# Patient Record
Sex: Female | Born: 1960 | Race: White | Hispanic: No | Smoking: Former smoker
Health system: Southern US, Community
[De-identification: ages and names within clinical notes are randomized; demographics above are authoritative.]

## PROBLEM LIST (undated history)

## (undated) DIAGNOSIS — Z8616 Personal history of COVID-19: Secondary | ICD-10-CM

## (undated) DIAGNOSIS — K219 Gastro-esophageal reflux disease without esophagitis: Secondary | ICD-10-CM

## (undated) DIAGNOSIS — M1990 Unspecified osteoarthritis, unspecified site: Secondary | ICD-10-CM

## (undated) HISTORY — DX: Personal history of COVID-19: Z86.16

---

## 2003-01-04 ENCOUNTER — Other Ambulatory Visit: Admission: RE | Admit: 2003-01-04 | Discharge: 2003-01-04 | Payer: Self-pay | Admitting: Obstetrics and Gynecology

## 2004-11-22 ENCOUNTER — Other Ambulatory Visit: Admission: RE | Admit: 2004-11-22 | Discharge: 2004-11-22 | Payer: Self-pay | Admitting: Obstetrics and Gynecology

## 2011-05-12 ENCOUNTER — Other Ambulatory Visit: Payer: Self-pay | Admitting: Family Medicine

## 2011-05-12 DIAGNOSIS — G834 Cauda equina syndrome: Secondary | ICD-10-CM

## 2011-05-13 ENCOUNTER — Other Ambulatory Visit: Payer: Self-pay

## 2011-05-14 ENCOUNTER — Other Ambulatory Visit: Payer: Self-pay

## 2011-05-17 ENCOUNTER — Ambulatory Visit
Admission: RE | Admit: 2011-05-17 | Discharge: 2011-05-17 | Disposition: A | Discharge status: Home / Self Care | Payer: BC Managed Care – PPO | Source: Ambulatory Visit | Attending: Family Medicine | Admitting: Family Medicine

## 2011-05-17 DIAGNOSIS — G834 Cauda equina syndrome: Secondary | ICD-10-CM

## 2015-11-20 DIAGNOSIS — H524 Presbyopia: Secondary | ICD-10-CM | POA: Diagnosis not present

## 2016-09-04 DIAGNOSIS — L821 Other seborrheic keratosis: Secondary | ICD-10-CM | POA: Diagnosis not present

## 2016-09-04 DIAGNOSIS — Z720 Tobacco use: Secondary | ICD-10-CM | POA: Diagnosis not present

## 2016-09-09 DIAGNOSIS — L821 Other seborrheic keratosis: Secondary | ICD-10-CM | POA: Diagnosis not present

## 2017-05-17 DIAGNOSIS — J069 Acute upper respiratory infection, unspecified: Secondary | ICD-10-CM | POA: Diagnosis not present

## 2017-06-18 DIAGNOSIS — H524 Presbyopia: Secondary | ICD-10-CM | POA: Diagnosis not present

## 2018-01-22 ENCOUNTER — Other Ambulatory Visit: Payer: Self-pay | Admitting: Family Medicine

## 2018-01-22 DIAGNOSIS — Z1231 Encounter for screening mammogram for malignant neoplasm of breast: Secondary | ICD-10-CM

## 2018-01-22 DIAGNOSIS — R5383 Other fatigue: Secondary | ICD-10-CM | POA: Diagnosis not present

## 2018-01-22 DIAGNOSIS — N951 Menopausal and female climacteric states: Secondary | ICD-10-CM | POA: Diagnosis not present

## 2018-01-22 DIAGNOSIS — Z136 Encounter for screening for cardiovascular disorders: Secondary | ICD-10-CM | POA: Diagnosis not present

## 2018-01-22 DIAGNOSIS — Z23 Encounter for immunization: Secondary | ICD-10-CM | POA: Diagnosis not present

## 2018-01-22 DIAGNOSIS — Z0000 Encounter for general adult medical examination without abnormal findings: Secondary | ICD-10-CM | POA: Diagnosis not present

## 2018-01-22 DIAGNOSIS — E2839 Other primary ovarian failure: Secondary | ICD-10-CM

## 2018-03-11 DIAGNOSIS — J069 Acute upper respiratory infection, unspecified: Secondary | ICD-10-CM | POA: Diagnosis not present

## 2018-03-22 ENCOUNTER — Ambulatory Visit
Admission: RE | Admit: 2018-03-22 | Discharge: 2018-03-22 | Disposition: A | Discharge status: Home / Self Care | Payer: BLUE CROSS/BLUE SHIELD | Source: Ambulatory Visit | Attending: Family Medicine | Admitting: Family Medicine

## 2018-03-22 ENCOUNTER — Encounter: Payer: Self-pay | Admitting: Radiology

## 2018-03-22 DIAGNOSIS — Z1231 Encounter for screening mammogram for malignant neoplasm of breast: Secondary | ICD-10-CM | POA: Diagnosis not present

## 2018-03-22 DIAGNOSIS — M8588 Other specified disorders of bone density and structure, other site: Secondary | ICD-10-CM | POA: Diagnosis not present

## 2018-03-22 DIAGNOSIS — Z780 Asymptomatic menopausal state: Secondary | ICD-10-CM | POA: Diagnosis not present

## 2018-03-22 DIAGNOSIS — M85852 Other specified disorders of bone density and structure, left thigh: Secondary | ICD-10-CM | POA: Diagnosis not present

## 2018-03-22 DIAGNOSIS — E2839 Other primary ovarian failure: Secondary | ICD-10-CM

## 2018-04-19 DIAGNOSIS — M810 Age-related osteoporosis without current pathological fracture: Secondary | ICD-10-CM | POA: Diagnosis not present

## 2018-04-19 DIAGNOSIS — F17210 Nicotine dependence, cigarettes, uncomplicated: Secondary | ICD-10-CM | POA: Diagnosis not present

## 2018-04-23 DIAGNOSIS — L820 Inflamed seborrheic keratosis: Secondary | ICD-10-CM | POA: Diagnosis not present

## 2018-06-09 DIAGNOSIS — B349 Viral infection, unspecified: Secondary | ICD-10-CM | POA: Diagnosis not present

## 2018-11-26 DIAGNOSIS — H524 Presbyopia: Secondary | ICD-10-CM | POA: Diagnosis not present

## 2018-12-20 DIAGNOSIS — M546 Pain in thoracic spine: Secondary | ICD-10-CM | POA: Diagnosis not present

## 2018-12-20 DIAGNOSIS — M9902 Segmental and somatic dysfunction of thoracic region: Secondary | ICD-10-CM | POA: Diagnosis not present

## 2018-12-20 DIAGNOSIS — M9903 Segmental and somatic dysfunction of lumbar region: Secondary | ICD-10-CM | POA: Diagnosis not present

## 2018-12-20 DIAGNOSIS — M62830 Muscle spasm of back: Secondary | ICD-10-CM | POA: Diagnosis not present

## 2018-12-21 DIAGNOSIS — M546 Pain in thoracic spine: Secondary | ICD-10-CM | POA: Diagnosis not present

## 2018-12-21 DIAGNOSIS — M9902 Segmental and somatic dysfunction of thoracic region: Secondary | ICD-10-CM | POA: Diagnosis not present

## 2018-12-21 DIAGNOSIS — M62830 Muscle spasm of back: Secondary | ICD-10-CM | POA: Diagnosis not present

## 2018-12-21 DIAGNOSIS — M9903 Segmental and somatic dysfunction of lumbar region: Secondary | ICD-10-CM | POA: Diagnosis not present

## 2018-12-23 DIAGNOSIS — M9902 Segmental and somatic dysfunction of thoracic region: Secondary | ICD-10-CM | POA: Diagnosis not present

## 2018-12-23 DIAGNOSIS — M546 Pain in thoracic spine: Secondary | ICD-10-CM | POA: Diagnosis not present

## 2018-12-23 DIAGNOSIS — M62830 Muscle spasm of back: Secondary | ICD-10-CM | POA: Diagnosis not present

## 2018-12-23 DIAGNOSIS — M9903 Segmental and somatic dysfunction of lumbar region: Secondary | ICD-10-CM | POA: Diagnosis not present

## 2018-12-30 DIAGNOSIS — M4854XA Collapsed vertebra, not elsewhere classified, thoracic region, initial encounter for fracture: Secondary | ICD-10-CM | POA: Diagnosis not present

## 2018-12-30 DIAGNOSIS — M546 Pain in thoracic spine: Secondary | ICD-10-CM | POA: Diagnosis not present

## 2019-05-28 DIAGNOSIS — Z20828 Contact with and (suspected) exposure to other viral communicable diseases: Secondary | ICD-10-CM | POA: Diagnosis not present

## 2019-05-31 DIAGNOSIS — Z1152 Encounter for screening for COVID-19: Secondary | ICD-10-CM | POA: Diagnosis not present

## 2019-05-31 DIAGNOSIS — B349 Viral infection, unspecified: Secondary | ICD-10-CM | POA: Diagnosis not present

## 2019-07-08 ENCOUNTER — Ambulatory Visit: Payer: BC Managed Care – PPO | Attending: Internal Medicine

## 2019-07-08 DIAGNOSIS — Z23 Encounter for immunization: Secondary | ICD-10-CM

## 2019-07-08 NOTE — Progress Notes (Signed)
   Covid-19 Vaccination Clinic  Name:  Whitney Jennings    MRN: [id number] DOB: February 05, 1961  07/08/2019  [name] was observed post Covid-19 immunization for 15 minutes without incident. She was provided with Vaccine Information Sheet and instruction to access the V-Safe system.   [name] was instructed to call 911 with any severe reactions post vaccine: Marland Kitchen Difficulty breathing  . Swelling of face and throat  . A fast heartbeat  . A bad rash all over body  . Dizziness and weakness   Immunizations Administered    Name Date Dose VIS Date Route   Pfizer COVID-19 Vaccine 07/08/2019  1:42 PM 0.3 mL 05/11/2018 Intramuscular   Manufacturer: [employer], [employer]   Lot: [id number]   NDC: [address]-1

## 2019-08-06 ENCOUNTER — Ambulatory Visit: Payer: BC Managed Care – PPO | Attending: Internal Medicine

## 2019-08-06 DIAGNOSIS — Z23 Encounter for immunization: Secondary | ICD-10-CM

## 2019-08-06 NOTE — Progress Notes (Signed)
   Covid-19 Vaccination Clinic  Name:  ZHOE CATANIA    MRN: [id number] DOB: 20-Jul-1960  08/06/2019  Ms. Hedger was observed post Covid-19 immunization for 15 minutes without incident. She was provided with Vaccine Information Sheet and instruction to access the V-Safe system.   Ms. Parkhill was instructed to call 911 with any severe reactions post vaccine: Marland Kitchen Difficulty breathing  . Swelling of face and throat  . A fast heartbeat  . A bad rash all over body  . Dizziness and weakness   Immunizations Administered    Name Date Dose VIS Date Route   Pfizer COVID-19 Vaccine 08/06/2019 12:11 PM 0.3 mL 05/11/2018 Intramuscular   Manufacturer: [employer], [employer]   Lot: [id number]   NDC: [address]-0

## 2019-08-08 ENCOUNTER — Ambulatory Visit: Payer: BLUE CROSS/BLUE SHIELD

## 2019-09-16 DIAGNOSIS — M795 Residual foreign body in soft tissue: Secondary | ICD-10-CM | POA: Diagnosis not present

## 2019-12-16 DIAGNOSIS — H52223 Regular astigmatism, bilateral: Secondary | ICD-10-CM | POA: Diagnosis not present

## 2020-01-20 DIAGNOSIS — Z20822 Contact with and (suspected) exposure to covid-19: Secondary | ICD-10-CM | POA: Diagnosis not present

## 2020-02-21 DIAGNOSIS — R5383 Other fatigue: Secondary | ICD-10-CM | POA: Diagnosis not present

## 2020-03-31 IMAGING — MG DIGITAL SCREENING BILATERAL MAMMOGRAM WITH TOMO AND CAD
8 series · 9 of 24 positions shown · non-contrast
Comparison: Previous exam(s).

CLINICAL DATA: Screening.

EXAM:
DIGITAL SCREENING BILATERAL MAMMOGRAM WITH TOMO AND CAD

[L CC synth-2D]
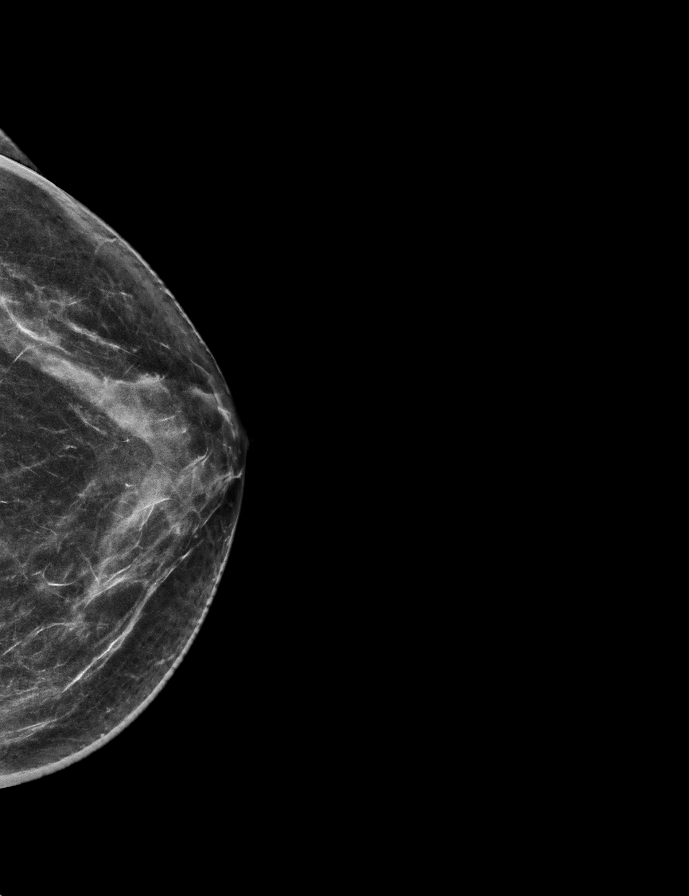

[R CC synth-2D]
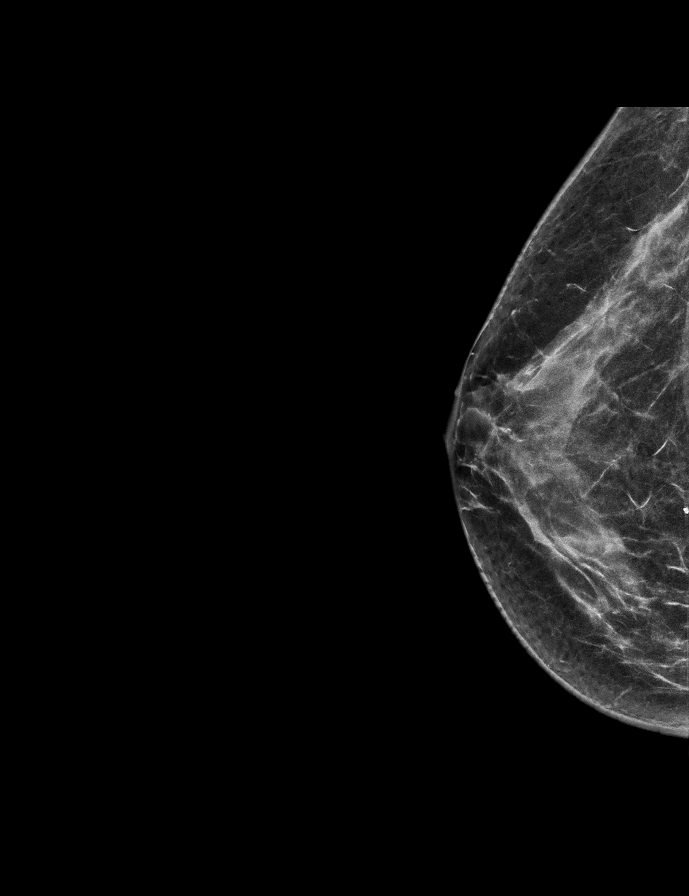

[R MLO synth-2D]
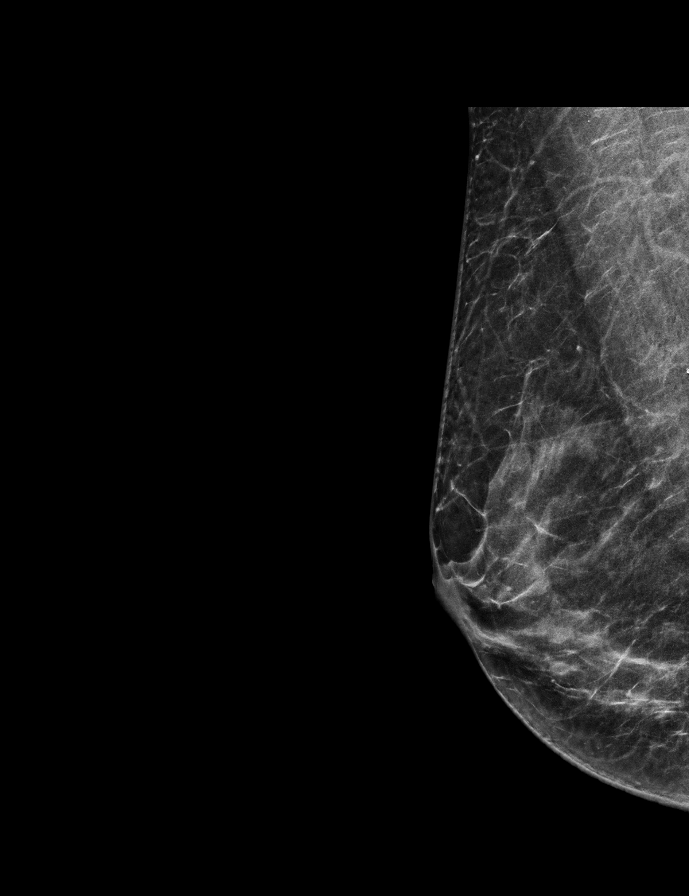

[L MLO synth-2D]
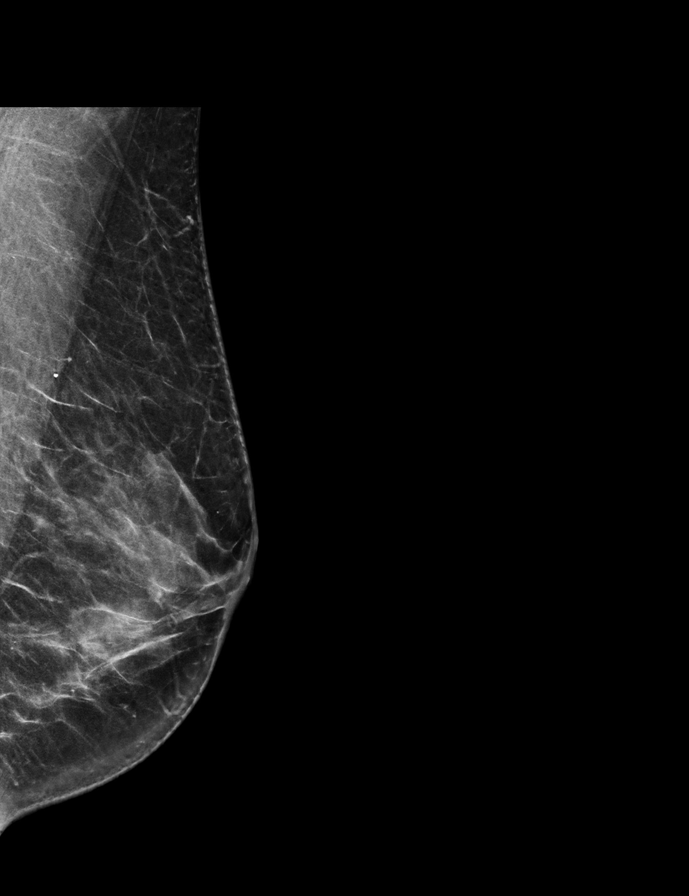

[R CC tomo · 2 of 60 frames shown]
[frame 20/60]
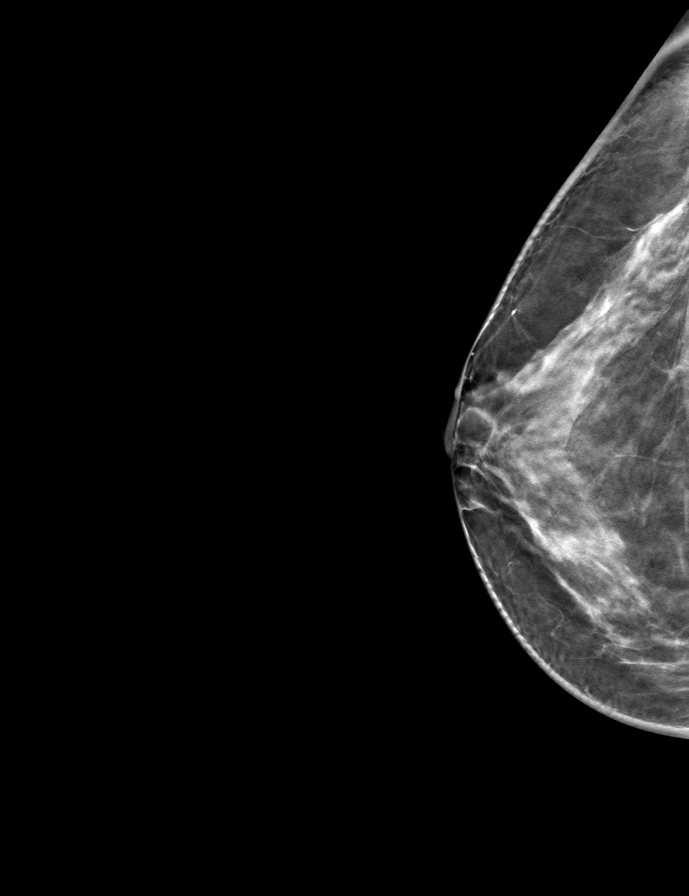
[frame 31/60]
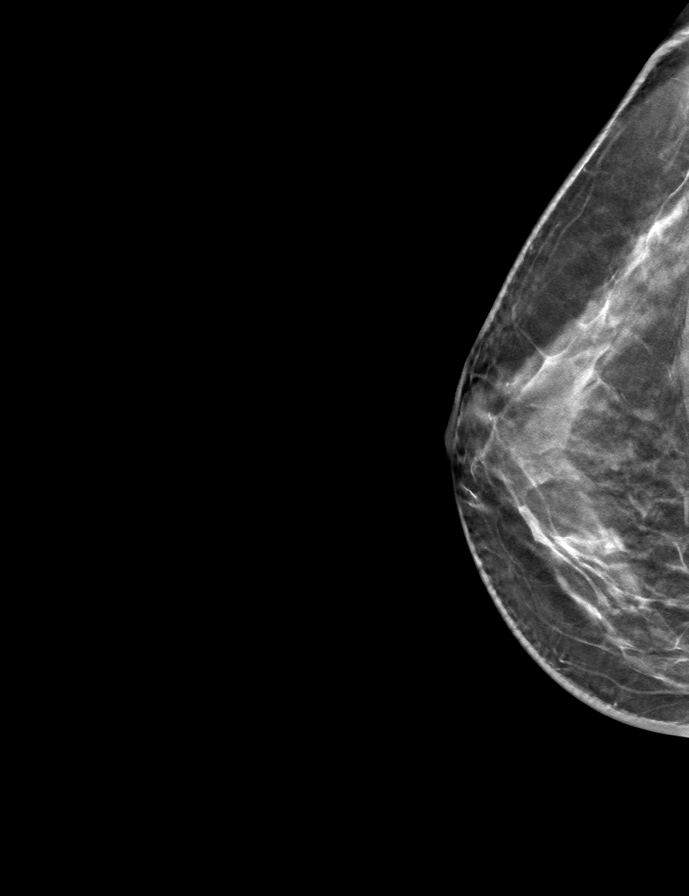

[L MLO tomo · tomo slice 33/64.0]
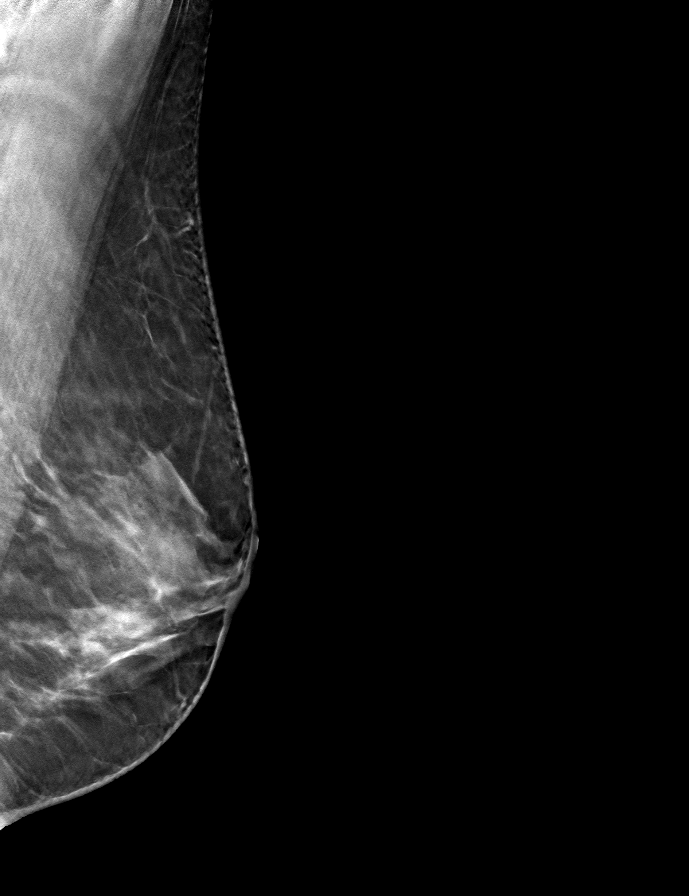

[R MLO tomo · tomo slice 29/56.0]
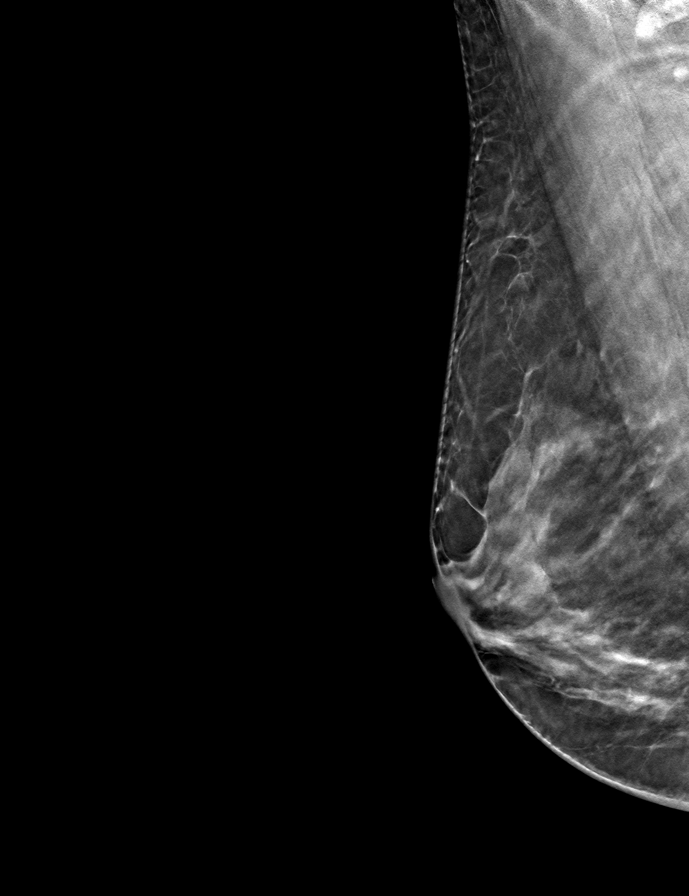

[L CC tomo · tomo slice 31/61.0]
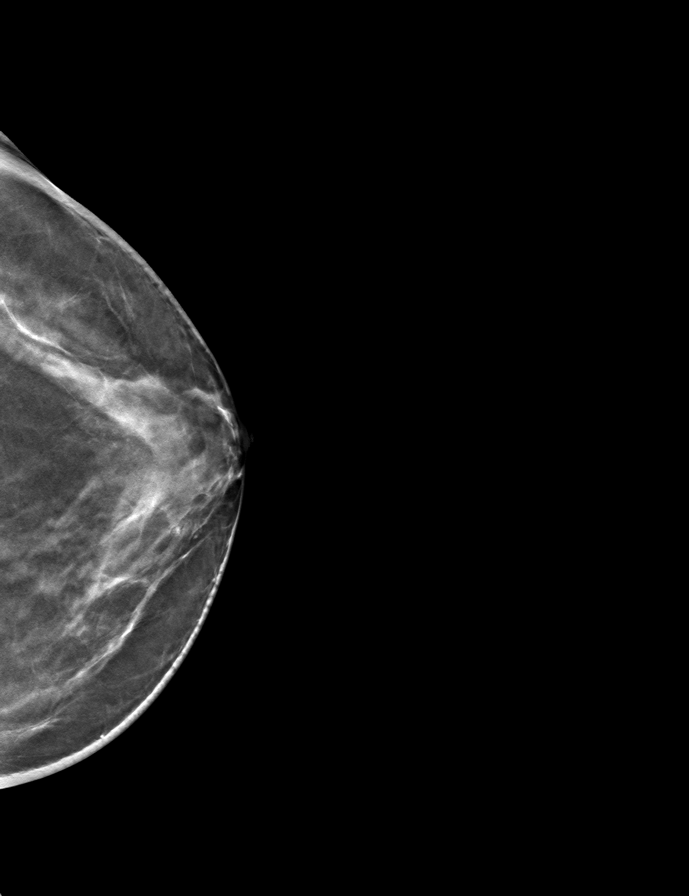

[9 of 24 positions shown; findings below may reference images not displayed]

ACR Breast Density Category c: The breast tissue is heterogeneously
dense, which may obscure small masses.
FINDINGS: There are no findings suspicious for malignancy. Images were
processed with CAD.
IMPRESSION: No mammographic evidence of malignancy. A result letter of this
screening mammogram will be mailed directly to the patient.

RECOMMENDATION:
Screening mammogram in one year. (Code:[id number])

BI-RADS CATEGORY  1: Negative.

## 2020-06-10 DIAGNOSIS — R5383 Other fatigue: Secondary | ICD-10-CM | POA: Diagnosis not present

## 2020-06-11 DIAGNOSIS — R5383 Other fatigue: Secondary | ICD-10-CM | POA: Diagnosis not present

## 2020-06-18 ENCOUNTER — Other Ambulatory Visit: Payer: Self-pay | Admitting: Family Medicine

## 2020-06-18 ENCOUNTER — Other Ambulatory Visit: Payer: Self-pay

## 2020-06-18 ENCOUNTER — Ambulatory Visit
Admission: RE | Admit: 2020-06-18 | Discharge: 2020-06-18 | Disposition: A | Discharge status: Home / Self Care | Payer: BC Managed Care – PPO | Source: Ambulatory Visit | Attending: Family Medicine | Admitting: Family Medicine

## 2020-06-18 DIAGNOSIS — I951 Orthostatic hypotension: Secondary | ICD-10-CM | POA: Diagnosis not present

## 2020-06-18 DIAGNOSIS — R5383 Other fatigue: Secondary | ICD-10-CM | POA: Diagnosis not present

## 2020-07-10 NOTE — Progress Notes (Signed)
Date:  07/12/2020   ID:  Whitney Jennings, DOB 1960/09/03, MRN [id number]  PCP:  Shirline Frees, MD  Cardiologist:  Rex Kras, DO, [facility] (established care 07/12/2020)  REASON FOR CONSULT: Orthostatic hypotension  REQUESTING PHYSICIAN:  Shirline Frees, MD [address] [place],  [place] [address]  Chief Complaint  Patient presents with  . Orthostatic hypotension  . New Patient (Initial Visit)  . Fatigue    HPI  Whitney Jennings is a 60 y.o. female who presents to the office with a chief complaint of "fatigue / orthostatic hypotension." Patient's past medical history and cardiovascular risk factors include: Cigarette smoking, Hx of COVID 19 infection.   She is referred to the office at the request of Shirline Frees, MD for evaluation of orthostatic hypotension.  Patient states that she has been experiencing generalized tired and fatigue for some time now and recently went to her PCP for further evaluation.  During office visit patient states that she had orthostatic hypotension with a drop of blood pressure of approximately 30 mmHg.  She was referred to cardiology for further evaluation and management.  Patient denies any chest pain or anginal equivalent.  She is overall euvolemic and not in congestive heart failure.  Patient states that the fatigue has been going on for at least 2 years.  Some days are worse than others.  Patient's fianc who is present at today's visit notes that there are days when " she cannot function, is on the couch all day."  Patient states that she does not skip any meals and has to consume protein rich snacks/meals every 2 hours to minimize her symptoms of generalized tired/fatigue.  She has also noticed lightheaded and dizziness with changing positions.  The symptoms are not reproducible with turning her head side to side.  No near-syncope or syncopal events.  Patient states that she consumes 2 cups of coffee in the morning and 2-3 glasses of  wine per week.  She denies the use of sodas, illicit drugs, energy drinks, stimulant medications, weight loss supplements.  She does consume a protein shake which is noted to have 70 different extracts (no additional details known).  She smokes on a regular basis less than 1 pack/day and has been doing so for at least the past 40 years.  Brother had MI at age of 43 does not know additional details.   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  Recently start walking 45 minutes a day.  ALLERGIES: No Known Allergies  MEDICATION LIST PRIOR TO VISIT: No outpatient medications have been marked as taking for the 07/12/20 encounter (Office Visit) with Rex Kras, DO.     PAST MEDICAL HISTORY: Past Medical History:  Diagnosis Date  . History of COVID-19     PAST SURGICAL HISTORY: History reviewed. No pertinent surgical history.  FAMILY HISTORY: The patient family history includes Diabetes in her brother and father; Pulmonary fibrosis in her father.  SOCIAL HISTORY:  The patient  reports that she has been smoking cigarettes. She has a 40.00 pack-year smoking history. She has never used smokeless tobacco. She reports current alcohol use of about 3.0 standard drinks of alcohol per week. She reports that she does not use drugs.  REVIEW OF SYSTEMS: Review of Systems  Constitutional: Positive for malaise/fatigue. Negative for chills and fever.  HENT: Negative for hoarse voice and nosebleeds.   Eyes: Negative for discharge, double vision and  pain.  Cardiovascular: Negative for chest pain, claudication, dyspnea on exertion, leg swelling, near-syncope, orthopnea, palpitations, paroxysmal nocturnal dyspnea and syncope.  Respiratory: Negative for hemoptysis and shortness of breath.   Musculoskeletal: Negative for muscle cramps and myalgias.  Gastrointestinal: Negative for abdominal pain, constipation, diarrhea, hematemesis, hematochezia, melena,  nausea and vomiting.  Neurological: Positive for dizziness and light-headedness.    PHYSICAL EXAM: Vitals with BMI 07/12/2020  Height $Remov'5\' 4"'EoyMIB$   Weight 134 lbs 6 oz  BMI 40.34  Systolic 742  Diastolic 67  Pulse 93   Orthostatic VS for the past 72 hrs (Last 3 readings):  Orthostatic BP Patient Position BP Location Cuff Size Orthostatic Pulse  07/12/20 1255 112/60 Standing Left Arm Normal 94  07/12/20 1254 121/66 Sitting Left Arm Normal 84  07/12/20 1253 117/63 Supine Left Arm Normal 85   CONSTITUTIONAL: Well-developed and well-nourished. No acute distress.  SKIN: Skin is warm and dry. No rash noted. No cyanosis. No pallor. No jaundice HEAD: Normocephalic and atraumatic.  EYES: No scleral icterus MOUTH/THROAT: Moist oral membranes.  NECK: No JVD present. No thyromegaly noted. No carotid bruits  LYMPHATIC: No visible cervical adenopathy.  CHEST Normal respiratory effort. No intercostal retractions  LUNGS: Clear to auscultation bilaterally.  No stridor. No wheezes. No rales.  CARDIOVASCULAR: Regular rate and rhythm, positive S1-S2, no murmurs rubs or gallops appreciated. ABDOMINAL: No apparent ascites.  EXTREMITIES: No peripheral edema  HEMATOLOGIC: No significant bruising NEUROLOGIC: Oriented to person, place, and time. Nonfocal. Normal muscle tone.  PSYCHIATRIC: Normal mood and affect. Normal behavior. Cooperative  CARDIAC DATABASE: EKG: 07/12/2020: Normal sinus rhythm, 81 bpm, normal axis, without underlying injury pattern.  Echocardiogram: No results found for this or any previous visit from the past 1095 days.   Stress Testing: No results found for this or any previous visit from the past 1095 days.  Heart Catheterization: None  LABORATORY DATA: No flowsheet data found.  No flowsheet data found.  Lipid Panel  No results found for: CHOL, TRIG, HDL, CHOLHDL, VLDL, LDLCALC, LDLDIRECT, LABVLDL  No components found for: NTPROBNP No results for input(s): PROBNP in the  last 8760 hours. No results for input(s): TSH in the last 8760 hours.  BMP No results for input(s): NA, K, CL, CO2, GLUCOSE, BUN, CREATININE, CALCIUM, GFRNONAA, GFRAA in the last 8760 hours.  HEMOGLOBIN A1C No results found for: HGBA1C, MPG   External Labs: Collected: 06/18/2020 Sodium 143, potassium 5.3, chloride 104, bicarb 32, BUN 23, creatinine 0.83, AST 24, ALT 27, alkaline phosphatase 82 CPK 79 (within normal limits) ESR 3 Cortisol 11.3  Date: 02/27/2020 Hemoglobin 15.8 g/dL, hematocrit 46.6%  IMPRESSION:    ICD-10-CM   1. Orthostatic hypotension  I95.1 EKG 12-Lead  2. Smoking  F17.200   3. History of COVID-19  Z86.16 PCV ECHOCARDIOGRAM COMPLETE    PCV CARDIAC STRESS TEST    SARS-COV-2 RNA,(COVID-19) QUAL NAAT  4. Family history of heart disease  Z82.49      RECOMMENDATIONS: BRIELE LAGASSE is a 60 y.o. female whose past medical history and cardiac risk factors include: Cigarette smoking, Hx of COVID 19 infection.   Patient does not have any active chest pain or anginal symptoms.  She is overall euvolemic and not in congestive heart failure.  Her symptoms of fatigue have been present for the last 2 years and I suspect it is most likely metabolic will defer to primary for additional work-up.  May consider endocrinology/dietitian to help with caloric intake and consumption of appropriate micro micronutrients.  Patient  is noted to have history of orthostatic hypotension during her last PCPs visit; however, orthostatics at today's office visit were within normal limits.  She has symptoms of lightheaded and dizziness with change in position and I have asked her to change her positions slowly, keeping her self well-hydrated, and days when she has exacerbation of the symptoms to increase some salt intake.  She can also consider compression stockings and avoid such activities that precipitate her symptoms of lightheaded and dizziness if possible.  With regards to her family history  of heart disease and history of COVID-19 infection and generalized tired and fatigue has been prolonged for the last 2 years we discussed undergoing an echocardiogram to evaluate for structural heart disease and GXT to evaluate for functional status and exercise-induced ischemia.  Patient states that she would like to discuss this further with PCP and will have it scheduled.  Educated on importance of complete smoking cessation.  Patient states that she been smoking for the last 40 years and currently is less than 1 pack/day.  Patient appears to be willing to stop smoking and will discuss with PCP with regards to further assistance and guidance.  FINAL MEDICATION LIST END OF ENCOUNTER: No orders of the defined types were placed in this encounter.   There are no discontinued medications.  No current outpatient medications on file.  Orders Placed This Encounter  Procedures  . SARS-COV-2 RNA,(COVID-19) QUAL NAAT  . PCV CARDIAC STRESS TEST  . EKG 12-Lead  . PCV ECHOCARDIOGRAM COMPLETE    There are no Patient Instructions on file for this visit.   --Continue cardiac medications as reconciled in final medication list. --Return in about 4 weeks (around 08/09/2020) for Follow up. Or sooner if needed. --Continue follow-up with your primary care physician regarding the management of your other chronic comorbid conditions.  Patient's questions and concerns were addressed to her satisfaction. She voices understanding of the instructions provided during this encounter.   This note was created using a voice recognition software as a result there may be grammatical errors inadvertently enclosed that do not reflect the nature of this encounter. Every attempt is made to correct such errors.  Rex Kras, [place], [facility]  Pager: [phone] Office: [phone]

## 2020-07-12 ENCOUNTER — Encounter: Payer: Self-pay | Admitting: Cardiology

## 2020-07-12 ENCOUNTER — Ambulatory Visit: Payer: BC Managed Care – PPO | Admitting: Cardiology

## 2020-07-12 ENCOUNTER — Other Ambulatory Visit: Payer: Self-pay

## 2020-07-12 VITALS — BP 113/67 | HR 93 | Temp 98.4°F | Resp 16 | Ht 64.0 in | Wt 134.4 lb

## 2020-07-12 DIAGNOSIS — Z8249 Family history of ischemic heart disease and other diseases of the circulatory system: Secondary | ICD-10-CM

## 2020-07-12 DIAGNOSIS — F17210 Nicotine dependence, cigarettes, uncomplicated: Secondary | ICD-10-CM | POA: Diagnosis not present

## 2020-07-12 DIAGNOSIS — I951 Orthostatic hypotension: Secondary | ICD-10-CM | POA: Diagnosis not present

## 2020-07-12 DIAGNOSIS — Z8616 Personal history of COVID-19: Secondary | ICD-10-CM | POA: Diagnosis not present

## 2020-07-12 DIAGNOSIS — F17200 Nicotine dependence, unspecified, uncomplicated: Secondary | ICD-10-CM

## 2020-07-19 DIAGNOSIS — R5383 Other fatigue: Secondary | ICD-10-CM | POA: Diagnosis not present

## 2020-09-26 DIAGNOSIS — J029 Acute pharyngitis, unspecified: Secondary | ICD-10-CM | POA: Diagnosis not present

## 2020-09-26 DIAGNOSIS — U071 COVID-19: Secondary | ICD-10-CM | POA: Diagnosis not present

## 2021-02-13 DIAGNOSIS — H52221 Regular astigmatism, right eye: Secondary | ICD-10-CM | POA: Diagnosis not present

## 2021-06-11 DIAGNOSIS — U071 COVID-19: Secondary | ICD-10-CM | POA: Diagnosis not present

## 2022-02-18 DIAGNOSIS — H5203 Hypermetropia, bilateral: Secondary | ICD-10-CM | POA: Diagnosis not present

## 2022-03-13 DIAGNOSIS — F17200 Nicotine dependence, unspecified, uncomplicated: Secondary | ICD-10-CM | POA: Diagnosis not present

## 2022-03-13 DIAGNOSIS — M810 Age-related osteoporosis without current pathological fracture: Secondary | ICD-10-CM | POA: Diagnosis not present

## 2022-03-13 DIAGNOSIS — Z0000 Encounter for general adult medical examination without abnormal findings: Secondary | ICD-10-CM | POA: Diagnosis not present

## 2022-03-13 DIAGNOSIS — Z83438 Family history of other disorder of lipoprotein metabolism and other lipidemia: Secondary | ICD-10-CM | POA: Diagnosis not present

## 2022-03-13 DIAGNOSIS — Z13220 Encounter for screening for lipoid disorders: Secondary | ICD-10-CM | POA: Diagnosis not present

## 2022-03-18 ENCOUNTER — Other Ambulatory Visit: Payer: Self-pay | Admitting: Family Medicine

## 2022-03-18 DIAGNOSIS — Z1231 Encounter for screening mammogram for malignant neoplasm of breast: Secondary | ICD-10-CM

## 2022-03-18 DIAGNOSIS — M810 Age-related osteoporosis without current pathological fracture: Secondary | ICD-10-CM

## 2022-06-28 IMAGING — CR DG CHEST 2V
2 series · 2 of 2 positions shown · non-contrast
Comparison: None.

CLINICAL DATA: 59-year-old female with orthostatic hypotension

EXAM:
CHEST - 2 VIEW

[w chest pa]
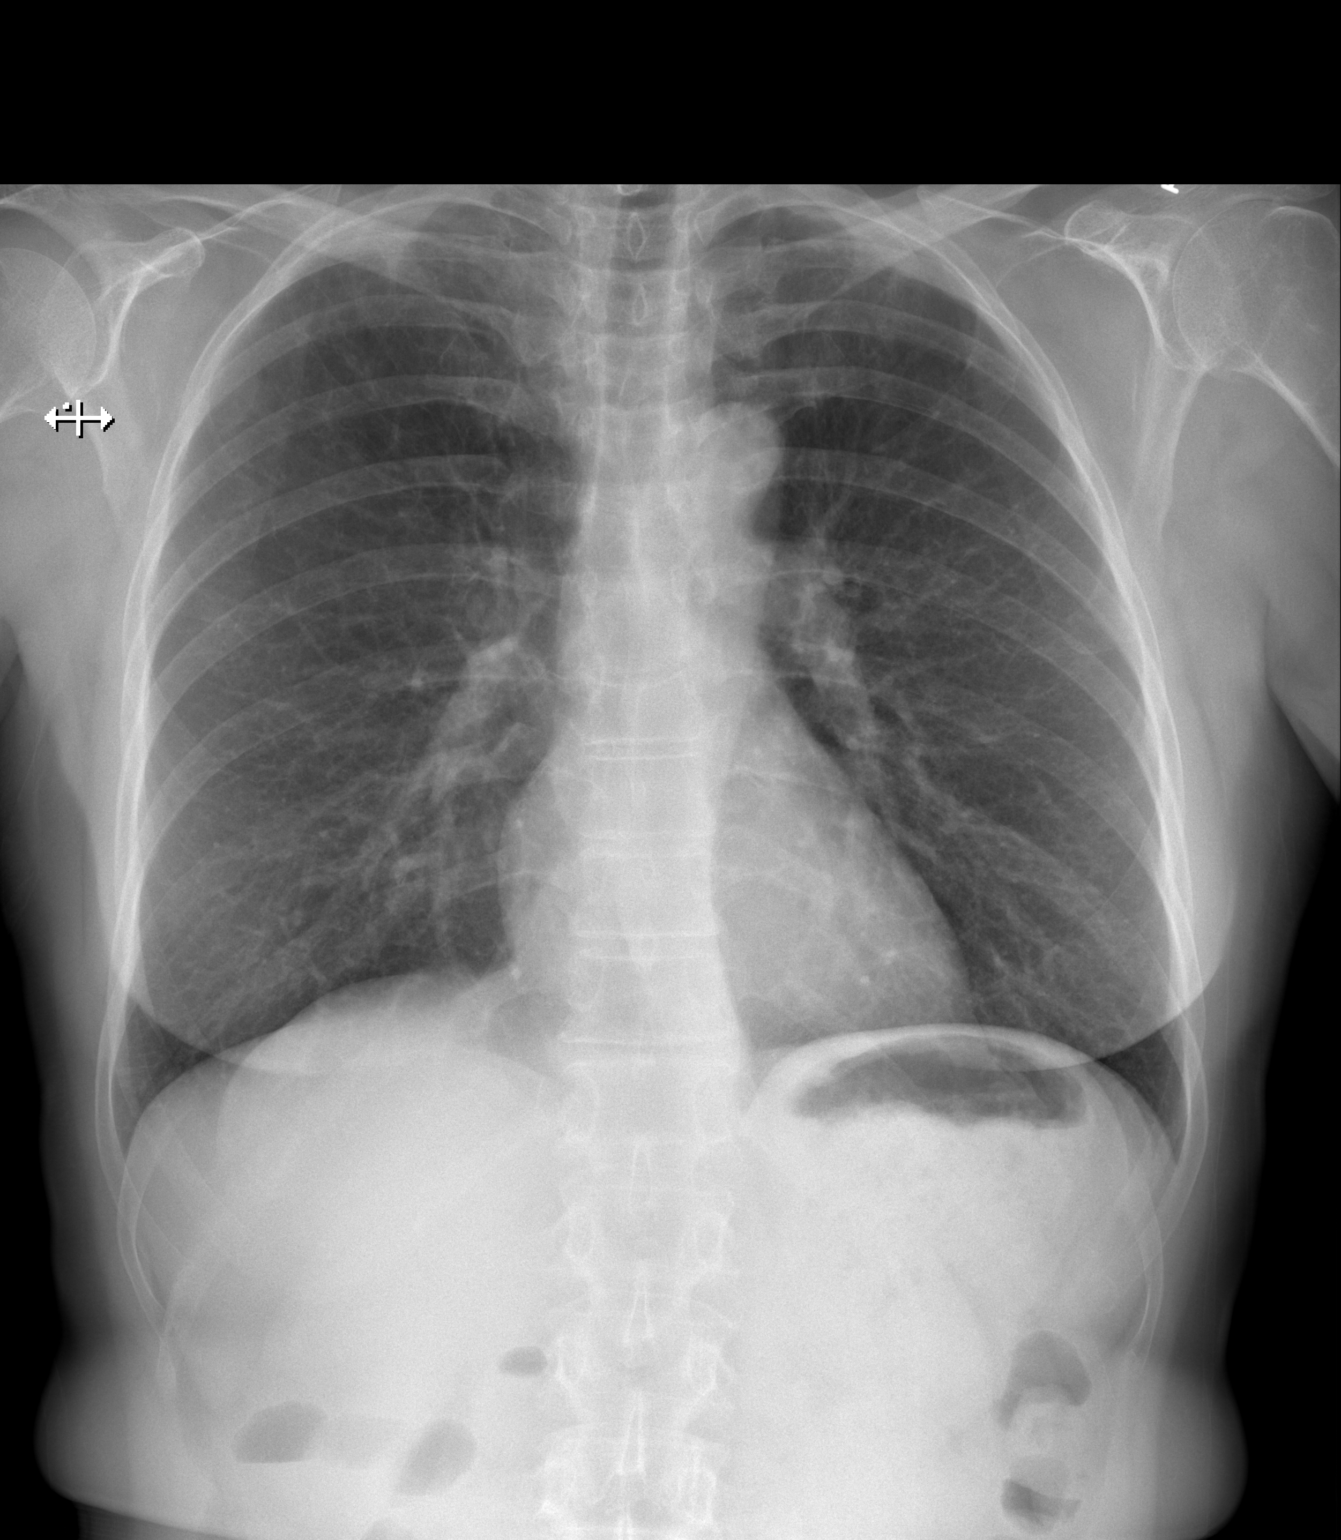

[w chest lat]
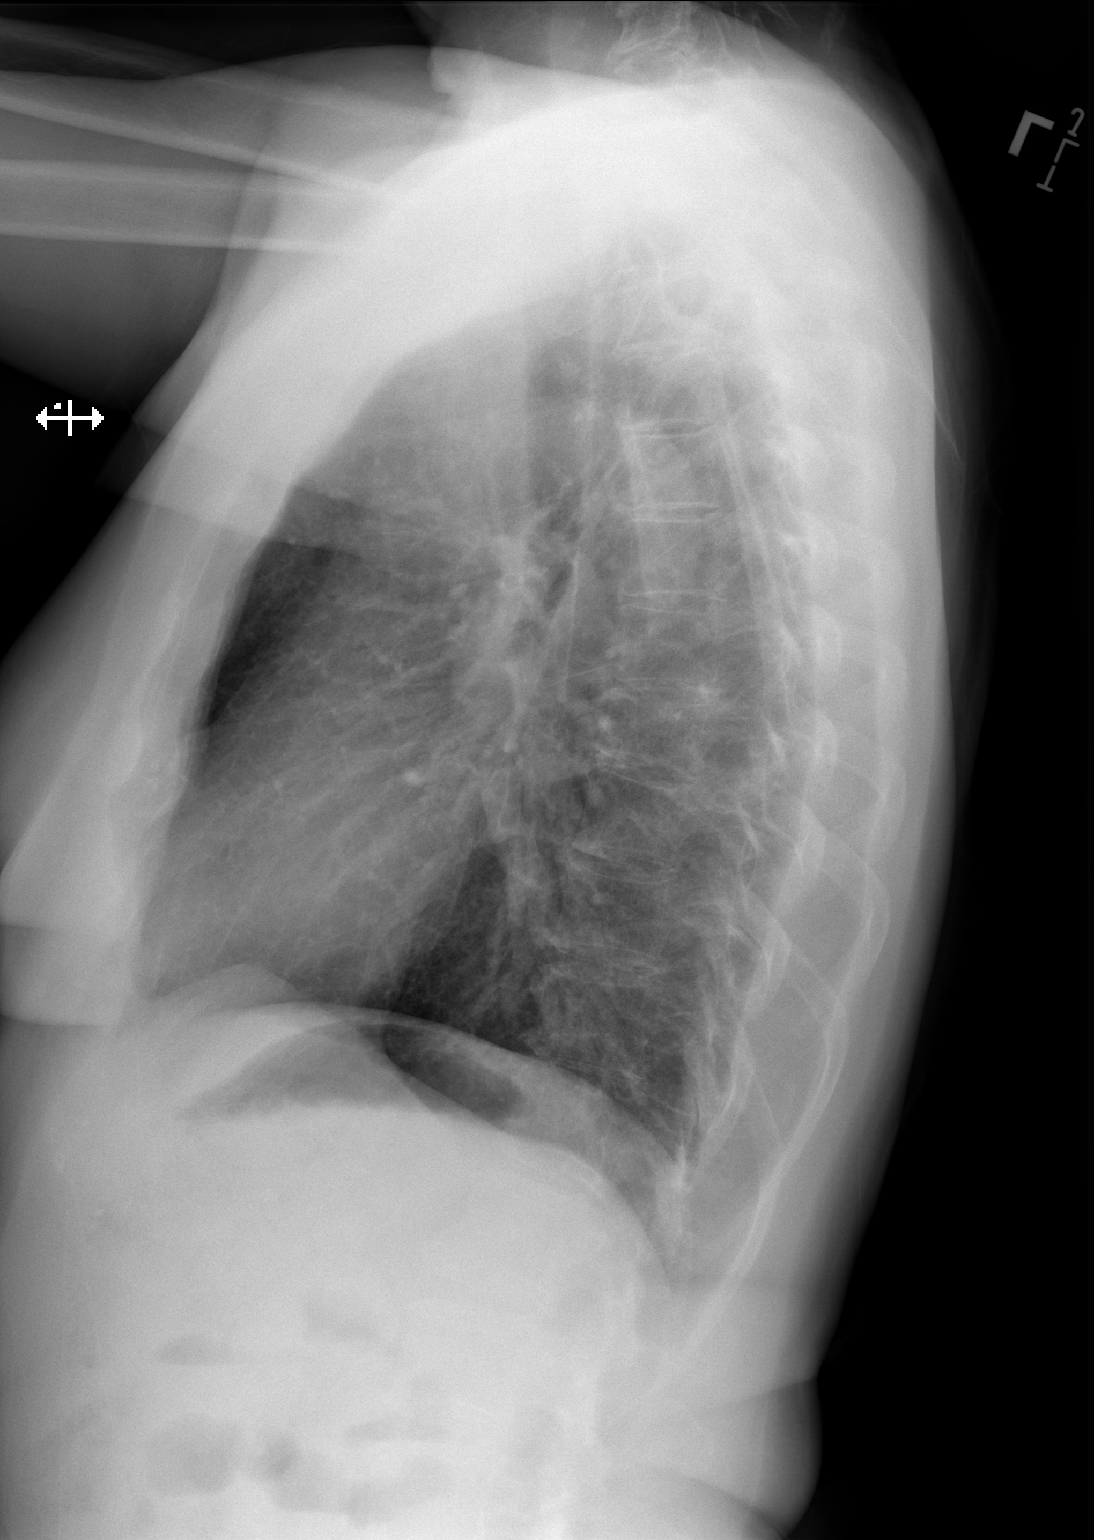

[2 of 2 positions shown; findings below may reference images not displayed]

FINDINGS: Cardiomediastinal silhouette within normal limits in size and
contour. No evidence of central vascular congestion. No interlobular
septal thickening. No pneumothorax or pleural effusion.
Pleuroparenchymal thickening at the apices, without comparison. No
confluent airspace disease.

Coarsened interstitial markings.

No acute displaced fracture.
IMPRESSION: Favored chronic lung changes without evidence of acute
cardiopulmonary disease

## 2022-08-22 DIAGNOSIS — D122 Benign neoplasm of ascending colon: Secondary | ICD-10-CM | POA: Diagnosis not present

## 2022-08-22 DIAGNOSIS — Z1211 Encounter for screening for malignant neoplasm of colon: Secondary | ICD-10-CM | POA: Diagnosis not present

## 2022-08-22 DIAGNOSIS — K5730 Diverticulosis of large intestine without perforation or abscess without bleeding: Secondary | ICD-10-CM | POA: Diagnosis not present

## 2022-08-22 DIAGNOSIS — K2930 Chronic superficial gastritis without bleeding: Secondary | ICD-10-CM | POA: Diagnosis not present

## 2022-08-22 DIAGNOSIS — K648 Other hemorrhoids: Secondary | ICD-10-CM | POA: Diagnosis not present

## 2022-08-22 DIAGNOSIS — K3189 Other diseases of stomach and duodenum: Secondary | ICD-10-CM | POA: Diagnosis not present

## 2022-08-22 DIAGNOSIS — R1084 Generalized abdominal pain: Secondary | ICD-10-CM | POA: Diagnosis not present

## 2022-08-22 DIAGNOSIS — R140 Abdominal distension (gaseous): Secondary | ICD-10-CM | POA: Diagnosis not present

## 2022-09-10 ENCOUNTER — Ambulatory Visit: Payer: BC Managed Care – PPO

## 2022-09-10 ENCOUNTER — Inpatient Hospital Stay: Admission: RE | Admit: 2022-09-10 | Payer: BC Managed Care – PPO | Source: Ambulatory Visit

## 2022-10-10 ENCOUNTER — Ambulatory Visit: Admission: RE | Admit: 2022-10-10 | Payer: BC Managed Care – PPO | Source: Ambulatory Visit

## 2022-10-10 DIAGNOSIS — Z1231 Encounter for screening mammogram for malignant neoplasm of breast: Secondary | ICD-10-CM

## 2023-02-20 DIAGNOSIS — H5203 Hypermetropia, bilateral: Secondary | ICD-10-CM | POA: Diagnosis not present

## 2023-09-23 DIAGNOSIS — M5414 Radiculopathy, thoracic region: Secondary | ICD-10-CM | POA: Diagnosis not present

## 2023-09-28 DIAGNOSIS — M546 Pain in thoracic spine: Secondary | ICD-10-CM | POA: Diagnosis not present

## 2023-09-28 DIAGNOSIS — M5414 Radiculopathy, thoracic region: Secondary | ICD-10-CM | POA: Diagnosis not present

## 2023-11-03 ENCOUNTER — Other Ambulatory Visit (HOSPITAL_BASED_OUTPATIENT_CLINIC_OR_DEPARTMENT_OTHER): Payer: Self-pay | Admitting: Orthopedic Surgery

## 2023-11-03 DIAGNOSIS — M546 Pain in thoracic spine: Secondary | ICD-10-CM

## 2023-11-03 DIAGNOSIS — M5414 Radiculopathy, thoracic region: Secondary | ICD-10-CM | POA: Diagnosis not present

## 2023-12-16 DIAGNOSIS — Z13220 Encounter for screening for lipoid disorders: Secondary | ICD-10-CM | POA: Diagnosis not present

## 2023-12-16 DIAGNOSIS — M810 Age-related osteoporosis without current pathological fracture: Secondary | ICD-10-CM | POA: Diagnosis not present

## 2023-12-16 DIAGNOSIS — F17200 Nicotine dependence, unspecified, uncomplicated: Secondary | ICD-10-CM | POA: Diagnosis not present

## 2023-12-16 DIAGNOSIS — R739 Hyperglycemia, unspecified: Secondary | ICD-10-CM | POA: Diagnosis not present

## 2023-12-16 DIAGNOSIS — Z0000 Encounter for general adult medical examination without abnormal findings: Secondary | ICD-10-CM | POA: Diagnosis not present

## 2023-12-17 ENCOUNTER — Other Ambulatory Visit: Payer: Self-pay | Admitting: Family Medicine

## 2023-12-17 DIAGNOSIS — F17200 Nicotine dependence, unspecified, uncomplicated: Secondary | ICD-10-CM

## 2023-12-18 ENCOUNTER — Encounter: Payer: Self-pay | Admitting: Family Medicine

## 2023-12-20 ENCOUNTER — Other Ambulatory Visit (HOSPITAL_BASED_OUTPATIENT_CLINIC_OR_DEPARTMENT_OTHER): Payer: Self-pay | Admitting: Family Medicine

## 2023-12-20 DIAGNOSIS — M810 Age-related osteoporosis without current pathological fracture: Secondary | ICD-10-CM

## 2023-12-25 ENCOUNTER — Ambulatory Visit
Admission: RE | Admit: 2023-12-25 | Discharge: 2023-12-25 | Disposition: A | Discharge status: Home / Self Care | Source: Ambulatory Visit | Attending: Family Medicine | Admitting: Family Medicine

## 2023-12-25 DIAGNOSIS — F17200 Nicotine dependence, unspecified, uncomplicated: Secondary | ICD-10-CM

## 2023-12-25 DIAGNOSIS — F17210 Nicotine dependence, cigarettes, uncomplicated: Secondary | ICD-10-CM | POA: Diagnosis not present

## 2024-04-18 ENCOUNTER — Emergency Department (HOSPITAL_BASED_OUTPATIENT_CLINIC_OR_DEPARTMENT_OTHER)

## 2024-04-18 ENCOUNTER — Emergency Department (HOSPITAL_BASED_OUTPATIENT_CLINIC_OR_DEPARTMENT_OTHER): Admitting: Radiology

## 2024-04-18 ENCOUNTER — Encounter (HOSPITAL_BASED_OUTPATIENT_CLINIC_OR_DEPARTMENT_OTHER): Payer: Self-pay

## 2024-04-18 ENCOUNTER — Other Ambulatory Visit: Payer: Self-pay

## 2024-04-18 ENCOUNTER — Emergency Department (HOSPITAL_BASED_OUTPATIENT_CLINIC_OR_DEPARTMENT_OTHER): Admission: EM | Admit: 2024-04-18 | Discharge: 2024-04-18 | Disposition: A | Discharge status: Home / Self Care

## 2024-04-18 DIAGNOSIS — S52611A Displaced fracture of right ulna styloid process, initial encounter for closed fracture: Secondary | ICD-10-CM | POA: Insufficient documentation

## 2024-04-18 DIAGNOSIS — S52611G Displaced fracture of right ulna styloid process, subsequent encounter for closed fracture with delayed healing: Secondary | ICD-10-CM

## 2024-04-18 DIAGNOSIS — S52501A Unspecified fracture of the lower end of right radius, initial encounter for closed fracture: Secondary | ICD-10-CM

## 2024-04-18 DIAGNOSIS — W000XXA Fall on same level due to ice and snow, initial encounter: Secondary | ICD-10-CM | POA: Insufficient documentation

## 2024-04-18 DIAGNOSIS — S52591A Other fractures of lower end of right radius, initial encounter for closed fracture: Secondary | ICD-10-CM | POA: Insufficient documentation

## 2024-04-18 MED ORDER — IBUPROFEN 400 MG PO TABS
600.0000 mg | ORAL_TABLET | Freq: Once | ORAL | Status: AC
  Administered 2024-04-18: 600 mg via ORAL
  Filled 2024-04-18: qty 1

## 2024-04-18 MED ORDER — CELECOXIB 200 MG PO CAPS: 200.0000 mg | ORAL_CAPSULE | Freq: Two times a day (BID) | ORAL | 0 refills | Status: DC

## 2024-04-18 MED ORDER — LIDOCAINE HCL (PF) 1 % IJ SOLN
10.0000 mL | Freq: Once | INTRAMUSCULAR | Status: AC
  Administered 2024-04-18: 10 mL
  Filled 2024-04-18: qty 10

## 2024-04-18 NOTE — ED Notes (Signed)
 Pt refused pain meds and ice in triage

## 2024-04-21 ENCOUNTER — Encounter (HOSPITAL_COMMUNITY): Payer: Self-pay | Admitting: Orthopedic Surgery

## 2024-04-21 ENCOUNTER — Other Ambulatory Visit: Payer: Self-pay

## 2024-04-21 NOTE — Progress Notes (Signed)
 SDW CALL  Patient was given pre-op instructions over the phone. The opportunity was given for the patient to ask questions. No further questions asked. Patient verbalized understanding of instructions given.  Patient aware to arrive at [facility] ED entrance on 2/7 at 0600   PCP - Elsie Lesches Cardiologist - denies  PPM/ICD - denies Device Orders - n/a Rep Notified - n/a  Chest x-ray - denies EKG - denies Stress Test - denies ECHO - denies Cardiac Cath - denies  Sleep Study - denies CPAP - n/a  Patient denies diabetes but A1C was 6.8 in October 2025  Last dose of GLP1 agonist-  n/a GLP1 instructions:  n/a  Blood Thinner Instructions:  n/a Aspirin Instructions: n/a  ERAS Protcol - clears until 0430 PRE-SURGERY Ensure or G2-  n/a  COVID TEST- n/a   Anesthesia review: no  Patient denies shortness of breath, fever, cough and chest pain over the phone call   All instructions explained to the patient, with a verbal understanding of the material. Patient agrees to go over the instructions while at home for a better understanding.

## 2024-04-23 ENCOUNTER — Encounter (HOSPITAL_COMMUNITY): Payer: Self-pay | Admitting: Anesthesiology

## 2024-04-23 ENCOUNTER — Encounter (HOSPITAL_COMMUNITY): Admission: RE | Payer: Self-pay | Source: Home / Self Care

## 2024-04-23 ENCOUNTER — Ambulatory Visit (HOSPITAL_COMMUNITY): Admission: RE | Admit: 2024-04-23 | Source: Home / Self Care | Admitting: Orthopedic Surgery

## 2024-04-23 HISTORY — DX: Unspecified osteoarthritis, unspecified site: M19.90

## 2024-04-23 HISTORY — DX: Gastro-esophageal reflux disease without esophagitis: K21.9
